# Patient Record
Sex: Female | Born: 1970 | Race: Black or African American | Hispanic: No | Smoking: Current every day smoker
Health system: Southern US, Community
[De-identification: ages and names within clinical notes are randomized; demographics above are authoritative.]

## PROBLEM LIST (undated history)

## (undated) ENCOUNTER — Inpatient Hospital Stay (HOSPITAL_COMMUNITY): Payer: Self-pay

## (undated) DIAGNOSIS — I10 Essential (primary) hypertension: Secondary | ICD-10-CM

## (undated) HISTORY — PX: OTHER SURGICAL HISTORY: SHX169

## (undated) HISTORY — PX: TUBAL LIGATION: SHX77

---

## 2004-01-23 ENCOUNTER — Emergency Department (HOSPITAL_COMMUNITY): Admission: EM | Admit: 2004-01-23 | Discharge: 2004-01-23 | Payer: Self-pay | Admitting: Emergency Medicine

## 2004-10-01 ENCOUNTER — Emergency Department: Payer: Self-pay | Admitting: Emergency Medicine

## 2006-07-28 ENCOUNTER — Ambulatory Visit (HOSPITAL_COMMUNITY): Admission: RE | Admit: 2006-07-28 | Discharge: 2006-07-28 | Payer: Self-pay | Admitting: Family Medicine

## 2007-01-18 ENCOUNTER — Observation Stay (HOSPITAL_COMMUNITY): Admission: AD | Admit: 2007-01-18 | Discharge: 2007-01-18 | Payer: Self-pay | Admitting: Obstetrics and Gynecology

## 2012-06-05 ENCOUNTER — Emergency Department (HOSPITAL_COMMUNITY): Payer: Medicaid Other

## 2012-06-05 ENCOUNTER — Emergency Department (HOSPITAL_COMMUNITY)
Admission: EM | Admit: 2012-06-05 | Discharge: 2012-06-05 | Disposition: A | Discharge status: Home / Self Care | Payer: Medicaid Other | Attending: Emergency Medicine | Admitting: Emergency Medicine

## 2012-06-05 ENCOUNTER — Encounter (HOSPITAL_COMMUNITY): Payer: Self-pay | Admitting: *Deleted

## 2012-06-05 DIAGNOSIS — B9689 Other specified bacterial agents as the cause of diseases classified elsewhere: Secondary | ICD-10-CM | POA: Insufficient documentation

## 2012-06-05 DIAGNOSIS — O2390 Unspecified genitourinary tract infection in pregnancy, unspecified trimester: Secondary | ICD-10-CM | POA: Insufficient documentation

## 2012-06-05 DIAGNOSIS — Z3490 Encounter for supervision of normal pregnancy, unspecified, unspecified trimester: Secondary | ICD-10-CM

## 2012-06-05 DIAGNOSIS — O26899 Other specified pregnancy related conditions, unspecified trimester: Secondary | ICD-10-CM

## 2012-06-05 DIAGNOSIS — N760 Acute vaginitis: Secondary | ICD-10-CM

## 2012-06-05 DIAGNOSIS — I10 Essential (primary) hypertension: Secondary | ICD-10-CM | POA: Insufficient documentation

## 2012-06-05 DIAGNOSIS — F17200 Nicotine dependence, unspecified, uncomplicated: Secondary | ICD-10-CM | POA: Insufficient documentation

## 2012-06-05 DIAGNOSIS — A499 Bacterial infection, unspecified: Secondary | ICD-10-CM | POA: Insufficient documentation

## 2012-06-05 HISTORY — DX: Essential (primary) hypertension: I10

## 2012-06-05 LAB — URINALYSIS, ROUTINE W REFLEX MICROSCOPIC
Bilirubin Urine: NEGATIVE
Glucose, UA: NEGATIVE mg/dL
Hgb urine dipstick: NEGATIVE
Hgb urine dipstick: NEGATIVE
Leukocytes, UA: NEGATIVE
Nitrite: NEGATIVE
Specific Gravity, Urine: 1.02 (ref 1.005–1.030)
Specific Gravity, Urine: 1.015 (ref 1.005–1.030)
Urobilinogen, UA: 0.2 mg/dL (ref 0.0–1.0)
Urobilinogen, UA: 0.2 mg/dL (ref 0.0–1.0)
pH: 6.5 (ref 5.0–8.0)

## 2012-06-05 LAB — URINE MICROSCOPIC-ADD ON: RBC / HPF: NONE SEEN RBC/hpf (ref <3)

## 2012-06-05 LAB — PREGNANCY, URINE: Preg Test, Ur: NEGATIVE

## 2012-06-05 MED ORDER — SODIUM CHLORIDE 0.9 % IV BOLUS (SEPSIS)
1000.0000 mL | Freq: Once | INTRAVENOUS | Status: AC
  Administered 2012-06-05: 1000 mL via INTRAVENOUS

## 2012-06-05 MED ORDER — AZITHROMYCIN 250 MG PO TABS
1000.0000 mg | ORAL_TABLET | Freq: Once | ORAL | Status: AC
  Administered 2012-06-05: 1000 mg via ORAL
  Filled 2012-06-05: qty 4

## 2012-06-05 MED ORDER — METRONIDAZOLE 500 MG PO TABS: 500.0000 mg | ORAL_TABLET | Freq: Two times a day (BID) | ORAL | Status: AC

## 2012-06-05 MED ORDER — LIDOCAINE HCL (PF) 1 % IJ SOLN
INTRAMUSCULAR | Status: AC
  Administered 2012-06-05: 5 mL via INTRAMUSCULAR
  Filled 2012-06-05: qty 5

## 2012-06-05 MED ORDER — CEFTRIAXONE SODIUM 250 MG IJ SOLR
250.0000 mg | Freq: Once | INTRAMUSCULAR | Status: AC
  Administered 2012-06-05: 250 mg via INTRAMUSCULAR
  Filled 2012-06-05: qty 250

## 2012-06-05 MED ORDER — CEPHALEXIN 500 MG PO CAPS: 500.0000 mg | ORAL_CAPSULE | Freq: Four times a day (QID) | ORAL | Status: AC

## 2012-06-05 NOTE — ED Notes (Signed)
In and out  Cath urine specimen obtained. Tests were repeated for accuracy due to initial negative preg test, when pt is in fact [redacted] weeks pregnant.

## 2012-06-05 NOTE — ED Notes (Signed)
Bil flank pain , x 3 days,  [redacted] weeks pregnant.  No vag bleeding. No fever, Has nausea .  No urinary sx.

## 2012-06-05 NOTE — ED Notes (Signed)
Patient transported to Ultrasound 

## 2012-06-05 NOTE — ED Notes (Signed)
Bed changed for pelvic exam per Dr. Denny Levy orders

## 2012-06-05 NOTE — ED Provider Notes (Signed)
History   This chart was scribed for Hilario Quarry, MD by Shari Heritage. The patient was seen in room APA18/APA18. Patient's care was started at 1344.     CSN: [id number]  Arrival date & time 06/05/12  1344   First MD Initiated Contact with Patient 06/05/12 1411      Chief Complaint  Patient presents with  . Flank Pain    pregnant    (Consider location/radiation/quality/duration/timing/severity/associated sxs/prior treatment) The history is provided by the patient. No language interpreter was used.   Madeline Cox is a 41 y.o. female who presents to the Emergency Department complaining of sharp right flank pain onset 3 days ago that has worsened today. Associated symptoms include nausea. Patient is [redacted] weeks pregnant. LMP 03/26/2012. Patient is G5P3A1. Patient's last pregnancy was five years ago. Patient's current care is handled by Dr. Billy Coast at [facility]. Patient denies fever, chills, frequency or urgency of urination, dysuria, hematuria, vomiting or diarrhea. Patient is a sexually active and reports no pain with vaginal intercourse. Patient's other children were delivered at [facility]. Patient with h/o HTN.   PCP - Billy Coast [facility])  Past Medical History  Diagnosis Date  . Hypertension   . Pregnant     Past Surgical History  Procedure Date  . Dilitation and curretage     History reviewed. No pertinent family history.  History  Substance Use Topics  . Smoking status: Current Everyday Smoker  . Smokeless tobacco: Not on file  . Alcohol Use: No    OB History    Grav Para Term Preterm Abortions TAB SAB Ect Mult Living   1               Review of Systems A complete 10 system review of systems was obtained and all systems are negative except as noted in the HPI and PMH.   Allergies  Penicillins  Home Medications   Current Outpatient Rx  Name Route Sig Dispense Refill  . ACETAMINOPHEN 500 MG PO TABS Oral Take 1,000  mg by mouth every 6 (six) hours as needed. For pain    . BC HEADACHE POWDER PO Oral Take 1 packet by mouth daily as needed. For pain    . IBUPROFEN 200 MG PO TABS Oral Take 600 mg by mouth every 6 (six) hours as needed. For pain    . PRENATAL 27-0.8 MG PO TABS Oral Take 1 tablet by mouth daily.      BP 149/91  Pulse 83  Temp 98.8 F (37.1 C) (Oral)  Resp 20  Ht 5\' 4"  (1.626 m)  Wt 165 lb (74.844 kg)  BMI 28.32 kg/m2  SpO2 100%  Physical Exam  Nursing note and vitals reviewed. Constitutional: She is oriented to person, place, and time. She appears well-developed and well-nourished.  HENT:  Head: Normocephalic and atraumatic.  Eyes: Conjunctivae and EOM are normal. Pupils are equal, round, and reactive to light.  Neck: Normal range of motion. Neck supple.  Cardiovascular: Normal rate and regular rhythm.   Pulmonary/Chest: Effort normal and breath sounds normal.  Abdominal: Soft. Bowel sounds are normal.  Genitourinary: Vagina normal.       Tenderness palpated diffusely over uterus but no mass .  Some whitish discharge in vaginal vault.   Musculoskeletal: Normal range of motion.  Neurological: She is alert and oriented to person, place, and time.  Skin: Skin is warm and dry.  Psychiatric: She has a normal mood and affect.  ED Course  Procedures (including critical care time) DIAGNOSTIC STUDIES: Oxygen Saturation is 100% on room air, normal by my interpretation.    COORDINATION OF CARE: 2:15PM- Patient informed of current plan for treatment and evaluation and agrees with plan at this time.      Labs Reviewed  URINALYSIS, ROUTINE W REFLEX MICROSCOPIC   No results found. Results for orders placed during the hospital encounter of 06/05/12  URINALYSIS, ROUTINE W REFLEX MICROSCOPIC      Component Value Range   Color, Urine YELLOW  YELLOW   APPearance CLEAR  CLEAR   Specific Gravity, Urine 1.020  1.005 - 1.030   pH 6.5  5.0 - 8.0   Glucose, UA NEGATIVE  NEGATIVE mg/dL     Hgb urine dipstick NEGATIVE  NEGATIVE   Bilirubin Urine NEGATIVE  NEGATIVE   Ketones, ur NEGATIVE  NEGATIVE mg/dL   Protein, ur NEGATIVE  NEGATIVE mg/dL   Urobilinogen, UA 0.2  0.0 - 1.0 mg/dL   Nitrite NEGATIVE  NEGATIVE   Leukocytes, UA TRACE (*) NEGATIVE  PREGNANCY, URINE      Component Value Range   Preg Test, Ur NEGATIVE  NEGATIVE  WET PREP, GENITAL      Component Value Range   Yeast Wet Prep HPF POC NONE SEEN  NONE SEEN   Trich, Wet Prep NONE SEEN  NONE SEEN   Clue Cells Wet Prep HPF POC MODERATE (*) NONE SEEN   WBC, Wet Prep HPF POC FEW (*) NONE SEEN  URINE MICROSCOPIC-ADD ON      Component Value Range   Squamous Epithelial / LPF FEW (*) RARE   WBC, UA 3-6  <3 WBC/hpf   RBC / HPF    <3 RBC/hpf   Value: NO FORMED ELEMENTS SEEN ON URINE MICROSCOPIC EXAMINATION   Bacteria, UA FEW (*) RARE     No diagnosis found.   No results found.  MDM  Patient with negative pregnancy test here. She has not had any vaginal bleeding. Her last menstrual period was in April. The results of her ultrasound are currently pending. Given the discharge and tenderness on her bimanual exam she was treated for STDs with Rocephin and Zithromax. She will be treated for bacterial vaginitis. Patient has had some crampy and sharp bilateral flank pain.  She states that she is pregnant but does not a positive pregnancy test here. It appear to have acute urinary tract infection. Her abdomen is soft and nontender. She does have some suprapubic and pelvic tenderness.   US Ob Transvaginal  06/05/2012  *RADIOLOGY REPORT*  Clinical Data: Bilateral flank pain. Positive urine pregnancy test  OBSTETRIC <14 WK [place] AND TRANSVAGINAL OB US  Technique:  Both transabdominal and transvaginal ultrasound examinations were performed for complete evaluation of the gestation as well as the maternal uterus, adnexal regions, and pelvic cul-de-sac.  Transvaginal technique was performed to assess early pregnancy.  Comparison:  None.   Intrauterine gestational sac:  Visualized/normal in shape. Yolk sac: Seen Embryo: Seen Cardiac Activity: Seen Heart Rate: 171 bpm  MSD:   mm      w     d CRL: 34.5   mm  10   w  2   d             [place] EDC: 12/30/2012  Maternal uterus/adnexae: A small subacute subchorionic hemorrhage is identified measuring 2.7 x 2.2 x 2.0 cm.  The ovaries are not seen with confidence either transabdominally or endovaginally.  IMPRESSION: Single living intrauterine  pregnancy demonstrating an estimated gestational age by crown-rump length of 10 weeks 2 days.  This correlates well with expected estimated gestational age by LMP of 10 weeks 1 day with corresponding EDC of 12/31/2012.  Small subacute subchorionic hemorrhage.  Non-visualized ovaries  Original Report Authenticated By: Bertha Stakes, M.D.     Patient with 10 week in intrauterine pregnancy seen on ultrasound. She'll have a repeat urine sent for urine pregnancy and repeat urinalysis. She did have Rocephin and Zithromax given here in the emergency department for possible STD and pelvic infection. GC and Chlamydia are pending. Patient received a liter of normal saline and feels improved. She will be treated for bacterial vaginosis urine will be cultured and Keflex prescribed. She is following up at the [facility] where she has been seen previously.  She will continue her prenatal vitamins. She is placed on Flagyl 500 mg by mouth twice a day for 7 days for bacterial vaginosis.  I personally performed the services described in this documentation, which was scribed in my presence. The recorded information has been reviewed and considered.    Hilario Quarry, MD 06/05/12 [phone]

## 2012-06-05 NOTE — Discharge Instructions (Signed)
Bacterial Vaginosis Bacterial vaginosis is an infection of the vagina. A healthy vagina has many kinds of good germs (bacteria). Sometimes the number of good germs can change. This allows bad germs to move in and cause an infection. You may be given medicine (antibiotics) to treat the infection. Or, you may not need treatment at all. HOME CARE  Take your medicine as told. Finish them even if you start to feel better.   Do not have sex until you finish your medicine.   Do not douche.   Practice safe sex.   Tell your sex partner that you have an infection. They should see their doctor for treatment if they have problems.  GET HELP RIGHT AWAY IF:  You do not get better after 3 days of treatment.   You have grey fluid (discharge) coming from your vagina.   You have pain.   You have a temperature of 102 F (38.9 C) or higher.  MAKE SURE YOU:   Understand these instructions.  Will watch your conThreatened Miscarriage Bleeding during the first 20 weeks of pregnancy is common. This is sometimes called a threatened miscarriage. This is a pregnancy that is threatening to end before the twentieth week of pregnancy. Often this bleeding stops with bed rest or decreased activities as suggested by your caregiver and the pregnancy continues without any more problems. You may be asked to not have sexual intercourse, have orgasms or use tampons until further notice. Sometimes a threatened miscarriage can progress to a complete or incomplete miscarriage. This may or may not require further treatment. Some miscarriages occur before a woman misses a menstrual period and knows she is pregnant. Miscarriages occur in 15 to 20% of all pregnancies and usually occur during the first 13 weeks of the pregnancy. The exact cause of a miscarriage is usually never known. A miscarriage is natures way of ending a pregnancy that is abnormal or would not make it to term. There are some things that may put you at risk to have  a miscarriage, such as:  Hormone problems.   Infection of the uterus or cervix.   Chronic illness, diabetes for example, especially if it is not controlled.   Abnormal shaped uterus.   Fibroids in the uterus.   Incompetent cervix (the cervix is too weak to hold the baby).   Smoking.   Drinking too much alcohol. It's best not to drink any alcohol when you are pregnant.   Taking illegal drugs.  TREATMENT  When a miscarriage becomes complete and all products of conception (all the tissue in the uterus) have been passed, often no treatment is needed. If you think you passed tissue, save it in a container and take it to your doctor for evaluation. If the miscarriage is incomplete (parts of the fetus or placenta remain in the uterus), further treatment may be needed. The most common reason for further treatment is continued bleeding (hemorrhage) because pregnancy tissue did not pass out of the uterus. This often occurs if a miscarriage is incomplete. Tissue left behind may also become infected. Treatment usually is dilatation and curettage (the removal of the remaining products of pregnancy. This can be done by a simple sucking procedure (suction curettage) or a simple scraping of the inside of the uterus. This may be done in the hospital or in the caregiver's office. This is only done when your caregiver knows that there is no chance for the pregnancy to proceed to term. This is determined by physical examination, negative  pregnancy test, falling pregnancy hormone count and/or, an ultrasound revealing a dead fetus. Miscarriages are often a very emotional time for prospective mothers and fathers. This is not you or your partners fault. It did not occur because of an inadequacy in you or your partner. Nearly all miscarriages occur because the pregnancy has started off wrongly. At least half of these pregnancies have a chromosomal abnormality. It is almost always not inherited. Others may have  developmental problems with the fetus or placenta. This does not always show up even when the products miscarried are studied under the microscope. The miscarriage is nearly always not your fault and it is not likely that you could have prevented it from happening. If you are having emotional and grieving problems, talk to your health care provider and even seek counseling, if necessary, before getting pregnant again. You can begin trying for another pregnancy as soon as your caregiver says it is OK. HOME CARE INSTRUCTIONS   Your caregiver may order bed rest depending on how much bleeding and cramping you are having. You may be limited to only getting up to go to the bathroom. You may be allowed to continue light activity. You may need to make arrangements for the care of your other children and for any other responsibilities.   Keep track of the number of pads you use each day, how often you have to change pads and how saturated (soaked) they are. Record this information.   DO NOT USE TAMPONS. Do not douche, have sexual intercourse or orgasms until approved by your caregiver.   You may receive a follow up appointment for re-evaluation of your pregnancy and a repeat blood test. Re-evaluation often occurs after 2 days and again in 4 to 6 weeks. It is very important that you follow-up in the recommended time period.   If you are Rh negative and the father is Rh positive or you do not know the fathers' blood type, you may receive a shot (Rh immune globulin) to help prevent abnormal antibodies that can develop and affect the baby in any future pregnancies.  SEEK IMMEDIATE MEDICAL CARE IF:  You have severe cramps in your stomach, back, or abdomen.   You have a sudden onset of severe pain in the lower part of your abdomen.   You develop chills.   You run an unexplained temperature of 101 F (38.3 C) or higher.   You pass large clots or tissue. Save any tissue for your caregiver to inspect.   Your  bleeding increases or you become light-headed, weak, or have fainting episodes.   You have a gush of fluid from your vagina.   You pass out. This could mean you have a tubal (ectopic) pregnancy.  Document Released: 11/25/2005 Document Revised: 11/14/2011 Document Reviewed: 07/11/2008 [facility] Patient Information 2012 [place], [place].Pregnancy If you are planning on getting pregnant, it is a good idea to make a preconception appointment with your care- giver to discuss having a healthy lifestyle before getting pregnant. Such as, diet, weight, exercise, taking prenatal vitamins especially folic acid (it helps prevent brain and spinal cord defects), avoiding alcohol, smoking and illegal drugs, medical problems (diabetes, convulsions), family history of genetic problems, working conditions and immunizations. It is better to have knowledge of these things and do something about them before getting pregnant. In your pregnancy, it is important to follow certain guidelines to have a healthy baby. It is very important to get good prenatal care and follow your caregiver's instructions. Prenatal care  includes all the medical care you receive before your baby's birth. This helps to prevent problems during the pregnancy and childbirth. HOME CARE INSTRUCTIONS   Start your prenatal visits by the 12th week of pregnancy or before when possible. They are usually scheduled monthly at first. They are more often in the last 2 months before delivery. It is important that you keep your caregiver's appointments and follow your caregiver's instructions regarding medication use, exercise, and diet.   During pregnancy, you are providing food for you and your baby. Eat a regular, well-balanced diet. Choose foods such as meat, fish, milk and other dairy products, vegetables, fruits, whole-grain breads and cereals. Your caregiver will inform you of the ideal weight gain depending on your current height and weight. Drink lots of  liquids. Try to drink 8 glasses of water a day.   Alcohol is associated with a number of birth defects including fetal alcohol syndrome. It is best to avoid alcohol completely. Smoking will cause low birth rate and prematurity. Use of alcohol and nicotine during your pregnancy also increases the chances that your child will be chemically dependent later in their life and may contribute to SIDS (Sudden Infant Death Syndrome).   Do not use illegal drugs.   Only take prescription or over-the-counter medications that are recommended by your caregiver. Other medications can cause genetic and physical problems in the baby.   Morning sickness can often be helped by keeping soda crackers at the bedside. Eat a couple before arising in the morning.   A sexual relationship may be continued until near the end of pregnancy if there are no other problems such as early (premature) leaking of amniotic fluid from the membranes, vaginal bleeding, painful intercourse or belly (abdominal) pain.   Exercise regularly. Check with your caregiver if you are unsure of the safety of some of your exercises.   Do not use hot tubs, steam rooms or saunas. These increase the risk of fainting or passing out and hurting yourself and the baby. Swimming is OK for exercise. Get plenty of rest, including afternoon naps when possible especially in the third trimester.   Avoid toxic odors and chemicals.   Do not wear high heels. They may cause you to lose your balance and fall.   Do not lift over 5 pounds. If you do lift anything, lift with your legs and thighs, not your back.   Avoid long trips, especially in the third trimester.   If you have to travel out of the city or state, take a copy of your medical records with you.  SEEK IMMEDIATE MEDICAL CARE IF:   You develop an unexplained oral temperature above 102 F (38.9 C), or as your caregiver suggests.   You have leaking of fluid from the vagina. If leaking membranes are  suspected, take your temperature and inform your caregiver of this when you call.   There is vaginal spotting or bleeding. Notify your caregiver of the amount and how many pads are used.   You continue to feel sick to your stomach (nauseous) and have no relief from remedies suggested, or you throw up (vomit) blood or coffee ground like materials.   You develop upper abdominal pain.   You have round ligament discomfort in the lower abdominal area. This still must be evaluated by your caregiver.   You feel contractions of the uterus.   You do not feel the baby move, or there is less movement than before.   You have painful  urination.   You have abnormal vaginal discharge.   You have persistent diarrhea.   You get a severe headache.   You have problems with your vision.   You develop muscle weakness.   You feel dizzy and faint.   You develop shortness of breath.   You develop chest pain.   You have back pain that travels down to your leg and feet.   You feel irregular or a very fast heartbeat.   You develop excessive weight gain in a short period of time (5 pounds in 3 to 5 days).   You are involved with a domestic violence situation.  Document Released: 11/25/2005 Document Revised: 11/14/2011 Document Reviewed: 05/19/2009  [employer] Patient Information 2012 [employer]dition.   Will get help right away if you are not doing well or get worse.  Document Released: 09/03/2008 Document Revised: 11/14/2011 Document Reviewed: 09/03/2008 [facility] Patient Information 2012 [place], [place].

## 2012-06-06 LAB — GC/CHLAMYDIA PROBE AMP, GENITAL: Chlamydia, DNA Probe: NEGATIVE

## 2012-11-13 ENCOUNTER — Encounter (HOSPITAL_COMMUNITY): Payer: Self-pay | Admitting: *Deleted

## 2012-11-13 ENCOUNTER — Emergency Department (HOSPITAL_COMMUNITY)
Admission: EM | Admit: 2012-11-13 | Discharge: 2012-11-13 | Disposition: A | Discharge status: Home / Self Care | Payer: Medicaid Other | Attending: Emergency Medicine | Admitting: Emergency Medicine

## 2012-11-13 ENCOUNTER — Emergency Department (HOSPITAL_COMMUNITY): Payer: Medicaid Other

## 2012-11-13 DIAGNOSIS — R079 Chest pain, unspecified: Secondary | ICD-10-CM

## 2012-11-13 DIAGNOSIS — Z9889 Other specified postprocedural states: Secondary | ICD-10-CM | POA: Insufficient documentation

## 2012-11-13 DIAGNOSIS — R0789 Other chest pain: Secondary | ICD-10-CM | POA: Insufficient documentation

## 2012-11-13 DIAGNOSIS — R002 Palpitations: Secondary | ICD-10-CM | POA: Insufficient documentation

## 2012-11-13 DIAGNOSIS — O99330 Smoking (tobacco) complicating pregnancy, unspecified trimester: Secondary | ICD-10-CM | POA: Insufficient documentation

## 2012-11-13 DIAGNOSIS — O9989 Other specified diseases and conditions complicating pregnancy, childbirth and the puerperium: Secondary | ICD-10-CM | POA: Insufficient documentation

## 2012-11-13 DIAGNOSIS — Z3490 Encounter for supervision of normal pregnancy, unspecified, unspecified trimester: Secondary | ICD-10-CM

## 2012-11-13 LAB — CBC WITH DIFFERENTIAL/PLATELET
Basophils Absolute: 0 10*3/uL (ref 0.0–0.1)
Hemoglobin: 11.2 g/dL — ABNORMAL LOW (ref 12.0–15.0)
Lymphocytes Relative: 27 % (ref 12–46)
Lymphs Abs: 2.5 10*3/uL (ref 0.7–4.0)
MCH: 28.3 pg (ref 26.0–34.0)
Neutrophils Relative %: 63 % (ref 43–77)
RBC: 3.96 MIL/uL (ref 3.87–5.11)
WBC: 9.2 10*3/uL (ref 4.0–10.5)

## 2012-11-13 LAB — URINALYSIS, ROUTINE W REFLEX MICROSCOPIC
Hgb urine dipstick: NEGATIVE
Leukocytes, UA: NEGATIVE
Protein, ur: NEGATIVE mg/dL
Urobilinogen, UA: 0.2 mg/dL (ref 0.0–1.0)

## 2012-11-13 LAB — LACTATE DEHYDROGENASE: LDH: 138 U/L (ref 94–250)

## 2012-11-13 LAB — BASIC METABOLIC PANEL: GFR calc Af Amer: >90 mL/min (ref >90)

## 2012-11-13 MED ORDER — ACETAMINOPHEN 500 MG PO TABS
1000.0000 mg | ORAL_TABLET | Freq: Once | ORAL | Status: AC
  Administered 2012-11-13: 1000 mg via ORAL
  Filled 2012-11-13: qty 2

## 2012-11-13 MED ORDER — SODIUM CHLORIDE 0.9 % IV SOLN
Freq: Once | INTRAVENOUS | Status: AC
  Administered 2012-11-13: 21:00:00 via INTRAVENOUS

## 2012-11-13 MED ORDER — IOHEXOL 350 MG/ML SOLN
100.0000 mL | Freq: Once | INTRAVENOUS | Status: AC | PRN
  Administered 2012-11-13: 100 mL via INTRAVENOUS

## 2012-11-13 NOTE — ED Provider Notes (Signed)
History     CSN: [id number]  Arrival date & time 11/13/12  Madeline Cox   First MD Initiated Contact with Patient 11/13/12 1914      Chief Complaint  Patient presents with  . Chest Pain    (Consider location/radiation/quality/duration/timing/severity/associated sxs/prior treatment) HPI  Patient is G5 P3 AB 1, EDC January 23. Patient reports she has had a normal pregnancy. She was seen yesterday at the [facility] and is to be seen again in 2 weeks. She reports her fingers have felt numb for the past 2 weeks. She relates about 3 PM today while she was at work she started feeling a fluttering in her chest that lasted about 15 seconds and happened twice. She relates some chest pain in her right upper chest that is waxing and waning with short jabs and then a resulting pressure. She states she has felt lightheaded all day. She states she feels nervous. She has had nausea without vomiting, diarrhea, coughing, fever, urgency, vaginal discharge or bleeding. She states her ankles have been swelling for about a week and she has been short of breath for a period of time.  PCP [facility] health Department  Past Medical History  Diagnosis Date  . Hypertension   . Pregnant     Past Surgical History  Procedure Date  . Dilitation and curretage     History reviewed. No pertinent family history.  History  Substance Use Topics  . Smoking status: Current Every Day Smoker  . Smokeless tobacco: Not on file  . Alcohol Use: No  employed  OB History    Grav Para Term Preterm Abortions TAB SAB Ect Mult Living   1               Review of Systems  All other systems reviewed and are negative.    Allergies  Penicillins  Home Medications   Current Outpatient Rx  Name  Route  Sig  Dispense  Refill  . ACETAMINOPHEN 500 MG PO TABS   Oral   Take 500-1,000 mg by mouth every 6 (six) hours as needed. For pain         . HYDROCODONE-ACETAMINOPHEN 5-500 MG PO TABS   Oral  Take 1 tablet by mouth every 6 (six) hours as needed. For dental pain         . IBUPROFEN 200 MG PO TABS   Oral   Take 800 mg by mouth every 6 (six) hours as needed. For pain         . PRENATAL 27-0.8 MG PO TABS   Oral   Take 1 tablet by mouth daily.           BP 138/74  Pulse 94  Temp 98.3 F (36.8 C) (Oral)  Resp 18  Ht 5\' 4"  (1.626 m)  Wt 183 lb (83.008 kg)  BMI 31.41 kg/m2  SpO2 99%  Vital signs normal    Physical Exam  Nursing note and vitals reviewed. Constitutional: She is oriented to person, place, and time. She appears well-developed and well-nourished.  Non-toxic appearance. She does not appear ill. No distress.  HENT:  Head: Normocephalic and atraumatic.  Right Ear: External ear normal.  Left Ear: External ear normal.  Nose: Nose normal. No mucosal edema or rhinorrhea.  Mouth/Throat: Oropharynx is clear and moist and mucous membranes are normal. No dental abscesses or uvula swelling.  Eyes: Conjunctivae normal and EOM are normal. Pupils are equal, round, and reactive to light.  Neck: Normal  range of motion and full passive range of motion without pain. Neck supple.  Cardiovascular: Normal rate, regular rhythm and normal heart sounds.  Exam reveals no gallop and no friction rub.   No murmur heard. Pulmonary/Chest: Effort normal and breath sounds normal. No respiratory distress. She has no wheezes. She has no rhonchi. She has no rales. She exhibits no tenderness and no crepitus.         Area of pain noted  Abdominal: Soft. Normal appearance and bowel sounds are normal. She exhibits no distension. There is no tenderness. There is no rebound and no guarding.       Abdomen consistent with dates and is nontender  FHT 145  Musculoskeletal: Normal range of motion. She exhibits edema. She exhibits no tenderness.       Trace edema of lower legs Moves all extremities well.   Neurological: She is alert and oriented to person, place, and time. She has normal  strength. No cranial nerve deficit.  Skin: Skin is warm, dry and intact. No rash noted. No erythema. No pallor.  Psychiatric: She has a normal mood and affect. Her speech is normal and behavior is normal. Her mood appears not anxious.    ED Course  Procedures (including critical care time)   Medications  HYDROcodone-acetaminophen (VICODIN) 5-500 MG per tablet (not administered)  0.9 %  sodium chloride infusion (  Intravenous New Bag/Given 11/13/12 2032)  iohexol (OMNIPAQUE) 350 MG/ML injection 100 mL (100 mL Intravenous Contrast Given 11/13/12 2053)  acetaminophen (TYLENOL) tablet 1,000 mg (1000 mg Oral Given 11/13/12 2133)   21:15 D/W Dr Si Gaul,  radiologist, states the CT missed filling of the pulmonary artery tree, wants to repeat the scan.   21:25 D/W Dr Emelda Fear agrees to not repeat CT scan, if she had a significant clot feels would see some delayed filling defect feels she can be discharged.  21:30 Discussed with Dr Si Gaul again.   No arrythmia on the monitor.   Results for orders placed during the hospital encounter of 11/13/12  CBC WITH DIFFERENTIAL      Component Value Range   WBC 9.2  4.0 - 10.5 K/uL   RBC 3.96  3.87 - 5.11 MIL/uL   Hemoglobin 11.2 (*) 12.0 - 15.0 g/dL   HCT 16.1 (*) 09.6 - 04.5 %   MCV 78.0  78.0 - 100.0 fL   MCH 28.3  26.0 - 34.0 pg   MCHC 36.2 (*) 30.0 - 36.0 g/dL   RDW 40.9  81.1 - 91.4 %   Platelets 257  150 - 400 K/uL   Neutrophils Relative 63  43 - 77 %   Neutro Abs 5.8  1.7 - 7.7 K/uL   Lymphocytes Relative 27  12 - 46 %   Lymphs Abs 2.5  0.7 - 4.0 K/uL   Monocytes Relative 9  3 - 12 %   Monocytes Absolute 0.8  0.1 - 1.0 K/uL   Eosinophils Relative 0  0 - 5 %   Eosinophils Absolute 0.0  0.0 - 0.7 K/uL   Basophils Relative 0  0 - 1 %   Basophils Absolute 0.0  0.0 - 0.1 K/uL  BASIC METABOLIC PANEL      Component Value Range   Sodium 135  135 - 145 mEq/L   Potassium 3.5  3.5 - 5.1 mEq/L   Chloride 103  96 - 112 mEq/L   CO2 20  19 - 32 mEq/L    Glucose, Bld 87  70 - 99  mg/dL   BUN 3 (*) 6 - 23 mg/dL   Creatinine, Ser 1.61 (*) 0.50 - 1.10 mg/dL   Calcium 8.8  8.4 - 09.6 mg/dL   GFR calc non Af Amer >90  >90 mL/min   GFR calc Af Amer >90  >90 mL/min  TROPONIN I      Component Value Range   Troponin I <0.30  <0.30 ng/mL  URINALYSIS, ROUTINE W REFLEX MICROSCOPIC      Component Value Range   Color, Urine YELLOW  YELLOW   APPearance CLEAR  CLEAR   Specific Gravity, Urine 1.010  1.005 - 1.030   pH 6.0  5.0 - 8.0   Glucose, UA NEGATIVE  NEGATIVE mg/dL   Hgb urine dipstick NEGATIVE  NEGATIVE   Bilirubin Urine NEGATIVE  NEGATIVE   Ketones, ur NEGATIVE  NEGATIVE mg/dL   Protein, ur NEGATIVE  NEGATIVE mg/dL   Urobilinogen, UA 0.2  0.0 - 1.0 mg/dL   Nitrite NEGATIVE  NEGATIVE   Leukocytes, UA NEGATIVE  NEGATIVE  LACTATE DEHYDROGENASE      Component Value Range   LDH 138  94 - 250 U/L   Laboratory interpretation all normal except mild anemia     No results found.    Date: 11/13/2012  Rate: 93  Rhythm: normal sinus rhythm  QRS Axis: normal  Intervals: normal  ST/T Wave abnormalities: normal  Conduction Disutrbances:none  Narrative Interpretation:   Old EKG Reviewed: none available    1. Palpitations   2. Chest pain   3. Pregnancy    Plan discharge  Devoria Albe, MD, FACEP    MDM          Ward Givens, MD 11/13/12 2134

## 2012-11-13 NOTE — ED Notes (Signed)
Chest pain , felt heart was fluttering, dizzy,  Pt is [redacted] weeks pregnant, has felt fetal movement,  No vag bleeding.

## 2012-11-13 NOTE — ED Notes (Signed)
Pt complaining of chest pain and palpitations. Also [redacted] weeks pregnant. Pt states she has been feeling normal fetal movement. Fetal heart tones 140-145. NAD noted. NSR on monitor.

## 2012-12-05 ENCOUNTER — Encounter (HOSPITAL_COMMUNITY): Payer: Self-pay | Admitting: Emergency Medicine

## 2012-12-05 ENCOUNTER — Inpatient Hospital Stay (HOSPITAL_COMMUNITY)
Admission: EM | Admit: 2012-12-05 | Discharge: 2012-12-05 | Disposition: A | Discharge status: Home / Self Care | Payer: Medicaid Other | Attending: Emergency Medicine | Admitting: Emergency Medicine

## 2012-12-05 DIAGNOSIS — O10919 Unspecified pre-existing hypertension complicating pregnancy, unspecified trimester: Secondary | ICD-10-CM | POA: Insufficient documentation

## 2012-12-05 DIAGNOSIS — Z79899 Other long term (current) drug therapy: Secondary | ICD-10-CM | POA: Insufficient documentation

## 2012-12-05 DIAGNOSIS — O479 False labor, unspecified: Secondary | ICD-10-CM

## 2012-12-05 DIAGNOSIS — O4700 False labor before 37 completed weeks of gestation, unspecified trimester: Secondary | ICD-10-CM

## 2012-12-05 DIAGNOSIS — O331 Maternal care for disproportion due to generally contracted pelvis: Secondary | ICD-10-CM | POA: Insufficient documentation

## 2012-12-05 DIAGNOSIS — F17200 Nicotine dependence, unspecified, uncomplicated: Secondary | ICD-10-CM | POA: Insufficient documentation

## 2012-12-05 LAB — COMPREHENSIVE METABOLIC PANEL
ALT: 7 U/L (ref 0–35)
AST: 13 U/L (ref 0–37)
Albumin: 2.6 g/dL — ABNORMAL LOW (ref 3.5–5.2)
Alkaline Phosphatase: 142 U/L — ABNORMAL HIGH (ref 39–117)
CO2: 23 mEq/L (ref 19–32)
Chloride: 100 mEq/L (ref 96–112)
GFR calc non Af Amer: >90 mL/min (ref >90)
Potassium: 3.5 mEq/L (ref 3.5–5.1)
Total Bilirubin: 0.5 mg/dL (ref 0.3–1.2)

## 2012-12-05 LAB — URINALYSIS, ROUTINE W REFLEX MICROSCOPIC
Bilirubin Urine: NEGATIVE
Glucose, UA: NEGATIVE mg/dL
Hgb urine dipstick: NEGATIVE
Ketones, ur: >80 mg/dL — AB
Leukocytes, UA: NEGATIVE
Nitrite: NEGATIVE
Protein, ur: NEGATIVE mg/dL
Specific Gravity, Urine: 1.025 (ref 1.005–1.030)
Urobilinogen, UA: 0.2 mg/dL (ref 0.0–1.0)
pH: 6.5 (ref 5.0–8.0)

## 2012-12-05 LAB — CBC
Hemoglobin: 11.1 g/dL — ABNORMAL LOW (ref 12.0–15.0)
MCV: 76.4 fL — ABNORMAL LOW (ref 78.0–100.0)
Platelets: 262 10*3/uL (ref 150–400)
RDW: 14.1 % (ref 11.5–15.5)

## 2012-12-05 MED ORDER — PANTOPRAZOLE SODIUM 20 MG PO TBEC
20.0000 mg | DELAYED_RELEASE_TABLET | Freq: Once | ORAL | Status: AC
  Administered 2012-12-05: 20 mg via ORAL
  Filled 2012-12-05: qty 1

## 2012-12-05 MED ORDER — LABETALOL HCL 100 MG PO TABS
200.0000 mg | ORAL_TABLET | Freq: Once | ORAL | Status: AC
  Administered 2012-12-05: 200 mg via ORAL
  Filled 2012-12-05: qty 2

## 2012-12-05 MED ORDER — LABETALOL HCL 200 MG PO TABS: 200.0000 mg | ORAL_TABLET | Freq: Two times a day (BID) | ORAL | Status: DC

## 2012-12-05 MED ORDER — LABETALOL HCL 200 MG PO TABS: 200.0000 mg | ORAL_TABLET | Freq: Once | ORAL | Status: DC

## 2012-12-05 NOTE — ED Provider Notes (Signed)
History   This chart was scribed for Raeford Razor, MD by Sofie Rower, ED Scribe. The patient was seen in room APA01/APA01 and the patient's care was started at 11:35AM.  Level 5 Caveat: Immediate Medical Intervention  CSN: [id number]  Arrival date & time 12/05/12  1121   None     Chief Complaint  Patient presents with  . Vaginal Discharge    [redacted] weeks pregnant    (Consider location/radiation/quality/duration/timing/severity/associated sxs/prior treatment) Patient is a 41 y.o. female presenting with vaginal discharge. The history is provided by the patient. No language interpreter was used.  Vaginal Discharge This is a new problem. The current episode started more than 2 days ago (3 days ago). The problem occurs constantly. The problem has been gradually worsening. Pertinent negatives include no headaches. Nothing aggravates the symptoms. Nothing relieves the symptoms. She has tried nothing for the symptoms. The treatment provided no relief.    Madeline Cox is a 41 y.o. female , with a hx of 37 week pregnancy (G:5: P:3 A:1) and hypertension, who presents to the Emergency Department complaining of sudden, progressively worsening, vaginal discharge, onset three days ago (11/02/12).  Associated symptoms include abdominal cramping. The pt reports she began to notice an unusual, intermittent, abdominal cramping and abnormal vaginal discharge three days ago. The pt informs the abdominal cramping lasts 5-10 minutes in duration.   The pt denies headaches and visual disturbance.    The pt is a current everyday smoker, however, she does not drink alcohol.   Previous deliveries Madeline Cox, [place].    Past Medical History  Diagnosis Date  . Hypertension   . Pregnant     Past Surgical History  Procedure Date  . Dilitation and curretage     History reviewed. No pertinent family history.  History  Substance Use Topics  . Smoking status: Current Every Day Smoker  . Smokeless tobacco: Not  on file  . Alcohol Use: No    OB History    Grav Para Term Preterm Abortions TAB SAB Ect Mult Living   5 3   1  1   3       Review of Systems  Genitourinary: Positive for vaginal discharge.  Neurological: Negative for headaches.  All other systems reviewed and are negative.    Allergies  Penicillins  Home Medications   Current Outpatient Rx  Name  Route  Sig  Dispense  Refill  . ACETAMINOPHEN 500 MG PO TABS   Oral   Take 500-1,000 mg by mouth every 6 (six) hours as needed. For pain         . HYDROCODONE-ACETAMINOPHEN 5-500 MG PO TABS   Oral   Take 1 tablet by mouth every 6 (six) hours as needed. For dental pain         . IBUPROFEN 200 MG PO TABS   Oral   Take 800 mg by mouth every 6 (six) hours as needed. For pain         . PRENATAL 27-0.8 MG PO TABS   Oral   Take 1 tablet by mouth daily.           BP 157/88  Pulse 106  Temp 98 F (36.7 C) (Oral)  SpO2 100%  Physical Exam  Nursing note and vitals reviewed. Constitutional: She appears well-developed and well-nourished. No distress.  HENT:  Head: Normocephalic and atraumatic.  Eyes: Conjunctivae normal are normal. Right eye exhibits no discharge. Left eye exhibits no discharge.  Neck: Neck supple.  Cardiovascular: Normal rate, regular rhythm and normal heart sounds.  Exam reveals no gallop and no friction rub.   No murmur heard. Pulmonary/Chest: Effort normal and breath sounds normal. No respiratory distress.  Abdominal: Soft. She exhibits no distension. There is no tenderness.  Genitourinary:       Gravid uterus. Consistent with reported [redacted] week gestation period. Cervix dilated ~2cm.  Musculoskeletal: She exhibits no edema and no tenderness.  Neurological: She is alert.  Skin: Skin is warm and dry.  Psychiatric: She has a normal mood and affect. Her behavior is normal. Thought content normal.    ED Course  Procedures (including critical care time)  DIAGNOSTIC STUDIES: Oxygen Saturation is  100% on room air, normal by my interpretation.    COORDINATION OF CARE:  11:43 AM- Treatment plan discussed with patient. Pt agrees with treatment.  12:13PM- Phone consultation with on Call OB/GYN at [facility]. Pt's condition as well as corresponding risk factors including hypertension discussed.  12:20Pm- Recheck. Treatment plan concerning hospital admission discussed with patient. Pt agrees with treatment.  12:42 PM- Recheck. Treatment plan discussed with patient. Pt agrees with treatment.        Labs Reviewed  URINALYSIS, ROUTINE W REFLEX MICROSCOPIC - Abnormal; Notable for the following:    APPearance HAZY (*)     Ketones, ur >80 (*)     All other components within normal limits   No results found.   1. Preterm uterine contractions       MDM  41 year old female at 36 weeks and 2 days gestation. Contractions at approximately 8 minutes. Fetal heart rate with good variability with baseline around 140. Patient is hypertensive, but this is chronic. She is not complaining of any headaches, increasing swelling and she has no proteinuria to suggest preeclampsia. Discussed with OB. Will transfer to [facility] for further monitoring/eval.    I personally preformed the services scribed in my presence. The recorded information has been reviewed and considered. Raeford Razor, MD.    Raeford Razor, MD 12/05/12 [phone]

## 2012-12-05 NOTE — MAU Provider Note (Signed)
Chart reviewed and agree with management and plan.  

## 2012-12-05 NOTE — Progress Notes (Signed)
Isabel Caprice, RN was asked to place pt on EFM while RN in with another pt.

## 2012-12-05 NOTE — Progress Notes (Signed)
RROB spoke with ED-RN; pt in ed due to c/o contractions and increased discharge/leaking.  Pt is high risk due to hypertension, supposed to be managed at [facility], but has been receiving care at health department.  Plan to consult [facility] about care, etc.

## 2012-12-05 NOTE — Progress Notes (Signed)
Joellyn Haff, CNM at bedside - called to a delivery

## 2012-12-05 NOTE — Progress Notes (Signed)
Meal tray ordered 

## 2012-12-05 NOTE — ED Notes (Signed)
Pt c/o cramping and vaginal discharge x3 days. Pt is [redacted] weeks pregnant. Pt states cramping is intermittent. Cramping "last about 5-10 minutes".

## 2012-12-05 NOTE — ED Notes (Signed)
Rapid response RN reports that Baby looks "good" and pt may have had 2 or 3 contractions over .  Reports contractions have been approx apart.

## 2012-12-05 NOTE — ED Notes (Signed)
Pt on fetal monitor, Dr. Juleen China at bedside.

## 2012-12-05 NOTE — ED Notes (Signed)
Pt requesting to go to [facility] instead of [place].  Dr. Juleen China in room talking with pt.

## 2012-12-05 NOTE — MAU Provider Note (Signed)
History     CSN: [id number]  Arrival date and time: 12/05/12 1121   First Provider Initiated Contact with Patient 12/05/12 1805      Chief Complaint  Patient presents with  . Vaginal Discharge    [redacted] weeks pregnant   HPI Madeline Cox is a 41 y.o. [phone] female at [redacted]w[redacted]d who presents as transfer from [place] er w/ report of contractions and increased clear mucousy d/c that began this am.  Denies abnormal or malodorous d/c, vulvovaginal itching/irriation, urinary frequency, hesitancy, urgency, or dysuria. Reports good fm.  Denies vb or lof.  PNC at [facility] beginning at app 6wks, last appt last thurs, next appt this coming thurs.  Was on norvasc for chtn prior to pregnancy- was d/c'd when went to HD to begin care and has not been placed on any other antihypertensives during the pregnancy. 1hr glucola and anatomy screen wnl per pt.  Declined genetic screening.  No other complications during pregnancy.  Occ HA's for years, no changes w/ pregnancy, occ seeing spots started 1 month ago- not at present, denies ruq/epigastric pain, denies vomiting, but has some nausea since yesterday. Heartburn throughout pregnancy, worse in last 3 months- controlled well w/ zantac- requesting med now.  OB History    Grav Para Term Preterm Abortions TAB SAB Ect Mult Living   5 3 2 1 1  1   3       Past Medical History  Diagnosis Date  . Pregnant   . Hypertension     chronic    Past Surgical History  Procedure Date  . Dilitation and curretage     History reviewed. No pertinent family history.  History  Substance Use Topics  . Smoking status: Current Every Day Smoker  . Smokeless tobacco: Not on file  . Alcohol Use: No    Allergies:  Allergies  Allergen Reactions  . Penicillins Anaphylaxis    Prescriptions prior to admission  Medication Sig Dispense Refill  . acetaminophen-codeine (TYLENOL #3) 300-30 MG per tablet Take 1 tablet by mouth every 6 (six) hours as needed. For pain       . Prenatal Vit-Fe Fumarate-FA (MULTIVITAMIN-PRENATAL) 27-0.8 MG TABS Take 1 tablet by mouth daily.      . ranitidine (ZANTAC) 75 MG tablet Take 75-150 mg by mouth 2 (two) times daily as needed. For heartburn      . zolpidem (AMBIEN) 10 MG tablet Take 10 mg by mouth at bedtime as needed.        Review of Systems  Constitutional: Negative.   Eyes: Negative.   Respiratory: Negative.   Cardiovascular: Negative.   Gastrointestinal: Positive for heartburn and nausea. Negative for vomiting. Abdominal pain: intermittent low abd pain.  Genitourinary: Negative.   Musculoskeletal: Negative.   Skin: Negative.   Neurological: Positive for headaches.  Endo/Heme/Allergies: Negative.   Psychiatric/Behavioral: Negative.    Physical Exam   Blood pressure 157/86, pulse 88, temperature 98 F (36.7 C), temperature source Oral, resp. rate 18, SpO2 98.00%.  Physical Exam  Constitutional: She is oriented to person, place, and time. She appears well-developed and well-nourished.  HENT:  Head: Normocephalic.  Neck: Normal range of motion.  Cardiovascular: Normal rate and regular rhythm.   Respiratory: Effort normal and breath sounds normal.  GI: Soft.       gravid  Genitourinary:       SVE: 2.5/th/-3, vtx, ballotable, posterior  Musculoskeletal: Normal range of motion. She exhibits edema (1+ BLE edema).  Neurological: She  is alert and oriented to person, place, and time. She has normal reflexes.       No clonus  Skin: Skin is warm and dry.  Psychiatric: She has a normal mood and affect. Her behavior is normal. Judgment and thought content normal.   FHR: 130, mod variability, 15x15accels, no decels= Cat I UCs: mild, irregular  MAU Course  Procedures  EFM CBC, CMP, urine P/C ratio- cancelled SVE Protonix 20mg  po for report of heartburn Labetalol 200mg  po x 1 Attempted to obtain prenatal records from [facility]- only sent genetic counseling and u/s report- state they do not have any prenatal  records  Results for orders placed during the hospital encounter of 12/05/12 (from the past 24 hour(s))  URINALYSIS, ROUTINE W REFLEX MICROSCOPIC     Status: Abnormal   Collection Time   12/05/12 12:24 PM      Component Value Range   Color, Urine YELLOW  YELLOW   APPearance HAZY (*) CLEAR   Specific Gravity, Urine 1.025  1.005 - 1.030   pH 6.5  5.0 - 8.0   Glucose, UA NEGATIVE  NEGATIVE mg/dL   Hgb urine dipstick NEGATIVE  NEGATIVE   Bilirubin Urine NEGATIVE  NEGATIVE   Ketones, ur >80 (*) NEGATIVE mg/dL   Protein, ur NEGATIVE  NEGATIVE mg/dL   Urobilinogen, UA 0.2  0.0 - 1.0 mg/dL   Nitrite NEGATIVE  NEGATIVE   Leukocytes, UA NEGATIVE  NEGATIVE  CBC     Status: Abnormal   Collection Time   12/05/12  6:38 PM      Component Value Range   WBC 9.3  4.0 - 10.5 K/uL   RBC 4.20  3.87 - 5.11 MIL/uL   Hemoglobin 11.1 (*) 12.0 - 15.0 g/dL   HCT 40.9 (*) 81.1 - 91.4 %   MCV 76.4 (*) 78.0 - 100.0 fL   MCH 26.4  26.0 - 34.0 pg   MCHC 34.6  30.0 - 36.0 g/dL   RDW 78.2  95.6 - 21.3 %   Platelets 262  150 - 400 K/uL  COMPREHENSIVE METABOLIC PANEL     Status: Abnormal   Collection Time   12/05/12  6:38 PM      Component Value Range   Sodium 134 (*) 135 - 145 mEq/L   Potassium 3.5  3.5 - 5.1 mEq/L   Chloride 100  96 - 112 mEq/L   CO2 23  19 - 32 mEq/L   Glucose, Bld 93  70 - 99 mg/dL   BUN 4 (*) 6 - 23 mg/dL   Creatinine, Ser 0.86 (*) 0.50 - 1.10 mg/dL   Calcium 8.9  8.4 - 57.8 mg/dL   Total Protein 6.1  6.0 - 8.3 g/dL   Albumin 2.6 (*) 3.5 - 5.2 g/dL   AST 13  0 - 37 U/L   ALT 7  0 - 35 U/L   Alkaline Phosphatase 142 (*) 39 - 117 U/L   Total Bilirubin 0.5  0.3 - 1.2 mg/dL   GFR calc non Af Amer >90  >90 mL/min   GFR calc Af Amer >90  >90 mL/min   Follow-up Information    Follow up with [facility]. On 12/07/2012. (Call on Monday to schedule an appointment to be seen as soon as possible. )          Madeline Cox, [name]  Home Medication Instructions  ION:[id number]   Printed on:12/05/12 2033  Medication Information  Prenatal Vit-Fe Fumarate-FA (MULTIVITAMIN-PRENATAL) 27-0.8 MG TABS Take 1 tablet by mouth daily.           acetaminophen-codeine (TYLENOL #3) 300-30 MG per tablet Take 1 tablet by mouth every 6 (six) hours as needed. For pain           ranitidine (ZANTAC) 75 MG tablet Take 75-150 mg by mouth 2 (two) times daily as needed. For heartburn           zolpidem (AMBIEN) 10 MG tablet Take 10 mg by mouth at bedtime as needed.           labetalol (NORMODYNE) 200 MG tablet Take 1 tablet (200 mg total) by mouth once.              Assessment and Plan  A:  [redacted]w[redacted]d SIUP  Cat I FHR  CHTN w/ no evidence of pre-eclampsia at this time  Braxton Hick's   P:  D/C home  Rx Labetalol 200mg  po bid   F/U @ [facility] on Monday  Reviewed pre-e warning s/s   Discussed all w/ Dr. Hassan Rowan, [place] Every 12/05/2012, 6:39 PM

## 2012-12-05 NOTE — Progress Notes (Signed)
Spoke with Verlon Au RN at [facility] ED and advised that FHR reassuring with possible UI or contractions. AP MD will follow up with Dr. Penne Lash for further orders.

## 2012-12-05 NOTE — Progress Notes (Addendum)
Call placed to inquire about how much longer for meal tray.  Informed that "It has been marked ready and should be leaving the kitchen shortly."

## 2012-12-05 NOTE — Progress Notes (Signed)
Call placed again due to no meal tray brought for pt.  Informed that "it left the kitchen 12 minutes ago and should be there in a few minutes."

## 2012-12-05 NOTE — ED Notes (Signed)
Spoke with Florentina Addison, rapid response RN at [facility] and she will monitor pt.  Reports pt is having contractions.

## 2013-07-02 ENCOUNTER — Emergency Department (HOSPITAL_COMMUNITY): Payer: No Typology Code available for payment source

## 2013-07-02 ENCOUNTER — Emergency Department (HOSPITAL_COMMUNITY)
Admission: EM | Admit: 2013-07-02 | Discharge: 2013-07-02 | Disposition: A | Discharge status: Home / Self Care | Payer: No Typology Code available for payment source | Attending: Emergency Medicine | Admitting: Emergency Medicine

## 2013-07-02 ENCOUNTER — Encounter (HOSPITAL_COMMUNITY): Payer: Self-pay | Admitting: *Deleted

## 2013-07-02 DIAGNOSIS — M25551 Pain in right hip: Secondary | ICD-10-CM

## 2013-07-02 DIAGNOSIS — Z880 Allergy status to penicillin: Secondary | ICD-10-CM | POA: Insufficient documentation

## 2013-07-02 DIAGNOSIS — S79919A Unspecified injury of unspecified hip, initial encounter: Secondary | ICD-10-CM | POA: Insufficient documentation

## 2013-07-02 DIAGNOSIS — Z79899 Other long term (current) drug therapy: Secondary | ICD-10-CM | POA: Insufficient documentation

## 2013-07-02 DIAGNOSIS — R51 Headache: Secondary | ICD-10-CM | POA: Insufficient documentation

## 2013-07-02 DIAGNOSIS — R209 Unspecified disturbances of skin sensation: Secondary | ICD-10-CM | POA: Insufficient documentation

## 2013-07-02 DIAGNOSIS — Y9389 Activity, other specified: Secondary | ICD-10-CM | POA: Insufficient documentation

## 2013-07-02 DIAGNOSIS — S79929A Unspecified injury of unspecified thigh, initial encounter: Secondary | ICD-10-CM | POA: Insufficient documentation

## 2013-07-02 DIAGNOSIS — Y92410 Unspecified street and highway as the place of occurrence of the external cause: Secondary | ICD-10-CM | POA: Insufficient documentation

## 2013-07-02 DIAGNOSIS — I10 Essential (primary) hypertension: Secondary | ICD-10-CM | POA: Insufficient documentation

## 2013-07-02 DIAGNOSIS — F17200 Nicotine dependence, unspecified, uncomplicated: Secondary | ICD-10-CM | POA: Insufficient documentation

## 2013-07-02 MED ORDER — IBUPROFEN 800 MG PO TABS
800.0000 mg | ORAL_TABLET | Freq: Once | ORAL | Status: AC
  Administered 2013-07-02: 800 mg via ORAL
  Filled 2013-07-02: qty 1

## 2013-07-02 MED ORDER — HYDROCODONE-ACETAMINOPHEN 5-325 MG PO TABS
1.0000 | ORAL_TABLET | Freq: Once | ORAL | Status: AC
  Administered 2013-07-02: 1 via ORAL
  Filled 2013-07-02: qty 1

## 2013-07-02 NOTE — ED Notes (Signed)
Pt placed on LSB and c-collar PTA by [facility]
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
## 2013-07-02 NOTE — ED Notes (Signed)
Pt with HA all over, " feels like my face is numb", pain with palpation to right shoulder, hip and knee
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
## 2013-07-04 IMAGING — CT CT ANGIO CHEST
1 of 6 series · 5 of 36 positions shown · IV contrast (Omnipaque 300)
Comparison: None

CLINICAL DATA: 40-year-old pregnant female with chest pain and
shortness of breath.

CT ANGIOGRAPHY CHEST
TECHNIQUE: Multidetector CT imaging of the chest using the
standard protocol during bolus administration of intravenous
contrast. Multiplanar reconstructed images including MIPs were
obtained and reviewed to evaluate the vascular anatomy.
Contrast: 100mL OMNIPAQUE IOHEXOL 350 MG/ML SOLN

[Series 6: pe 3.0 b40f · axial · 0.59mm/px · z∈[+212,+356]mm · 5 of 72 slices shown]
[im 12/72  lung]
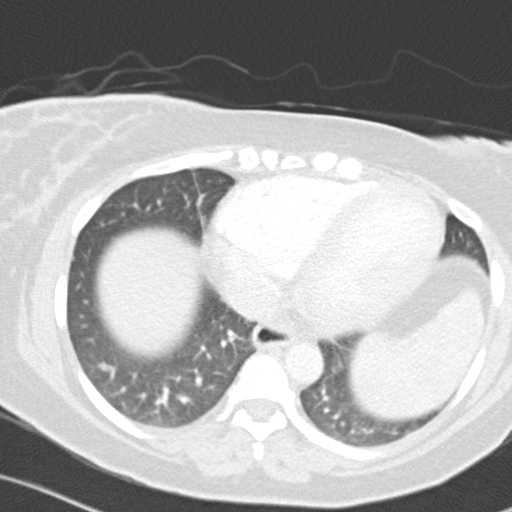
[im 24/72  mediastinal]
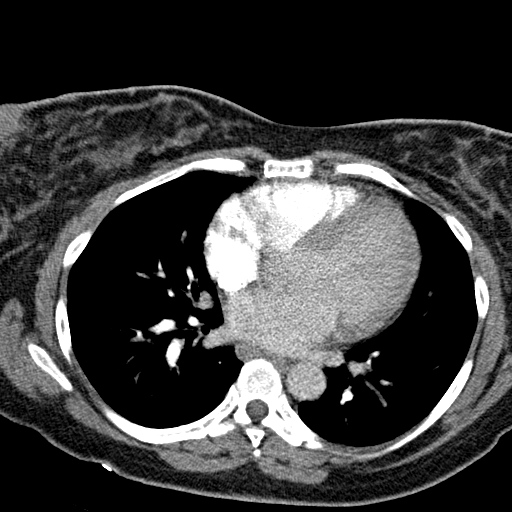
[im 36/72  lung]
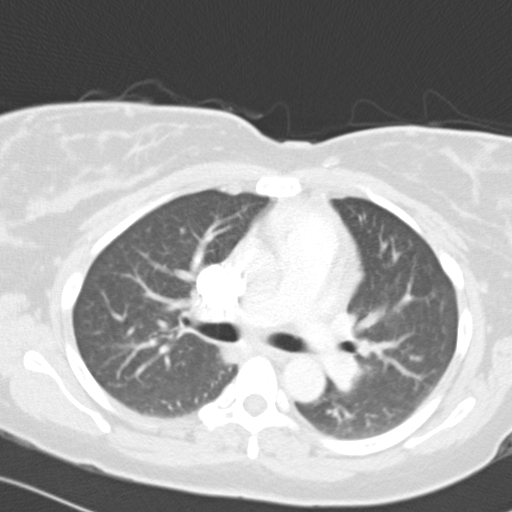
[im 48/72  mediastinal]
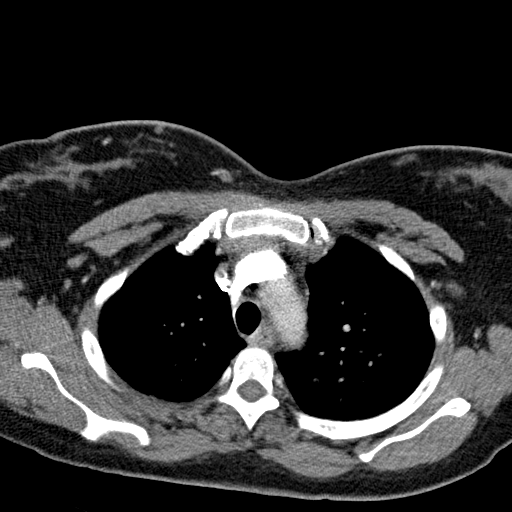
[im 60/72  lung]
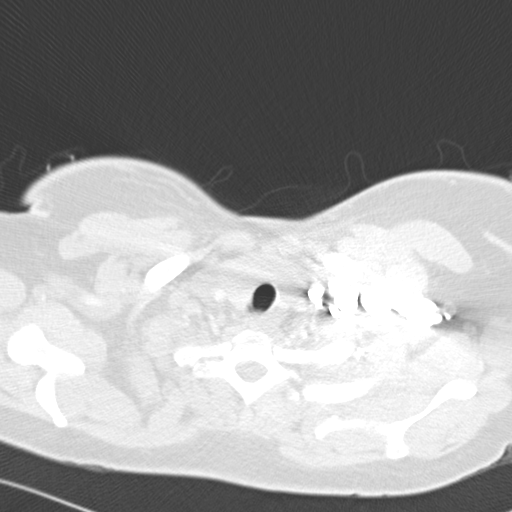

[5 of 36 positions shown; findings below may reference images not displayed]

FINDINGS: This is a technically inadequate study as there is poor
contrast opacification of the main and upper pulmonary arteries.
This was discussed with Dr. Klpigbb, who discussed this with Dr.
Cheyo.  They desire not to repeat the study.

No definite pulmonary emboli are identified within the lower lungs.
The main and upper pulmonary arteries are not well opacified.

Upper limits normal heart size noted.
There is no evidence of thoracic aortic aneurysm.

No pleural or pericardial effusions are identified.
No enlarged lymph nodes are noted.

The lungs are clear.
There is no evidence of airspace disease, consolidation, nodule or
mass.
No endobronchial or endotracheal lesions are present.

No acute or suspicious bony abnormalities are present.
IMPRESSION: Suboptimal evaluation for pulmonary emboli as described above.  No
definite pulmonary emboli identified within the lower lobes.

No significant abnormalities identified.

## 2013-07-30 ENCOUNTER — Other Ambulatory Visit (HOSPITAL_COMMUNITY): Payer: Self-pay | Admitting: Orthopedic Surgery

## 2013-07-30 DIAGNOSIS — M25561 Pain in right knee: Secondary | ICD-10-CM

## 2013-08-04 ENCOUNTER — Ambulatory Visit (HOSPITAL_COMMUNITY): Payer: No Typology Code available for payment source

## 2013-08-05 ENCOUNTER — Ambulatory Visit (HOSPITAL_COMMUNITY)
Admission: RE | Admit: 2013-08-05 | Discharge: 2013-08-05 | Disposition: A | Discharge status: Home / Self Care | Payer: No Typology Code available for payment source | Source: Ambulatory Visit | Attending: Orthopedic Surgery | Admitting: Orthopedic Surgery

## 2013-08-05 ENCOUNTER — Encounter (HOSPITAL_COMMUNITY): Payer: Self-pay

## 2013-08-05 DIAGNOSIS — M25569 Pain in unspecified knee: Secondary | ICD-10-CM | POA: Insufficient documentation

## 2013-08-05 DIAGNOSIS — M25561 Pain in right knee: Secondary | ICD-10-CM

## 2014-10-10 ENCOUNTER — Encounter (HOSPITAL_COMMUNITY): Payer: Self-pay

## 2015-07-10 ENCOUNTER — Encounter (HOSPITAL_COMMUNITY): Payer: Self-pay

## 2015-07-10 ENCOUNTER — Emergency Department (HOSPITAL_COMMUNITY)
Admission: EM | Admit: 2015-07-10 | Discharge: 2015-07-10 | Disposition: A | Discharge status: Home / Self Care | Payer: Self-pay | Attending: Emergency Medicine | Admitting: Emergency Medicine

## 2015-07-10 DIAGNOSIS — R197 Diarrhea, unspecified: Secondary | ICD-10-CM

## 2015-07-10 DIAGNOSIS — R011 Cardiac murmur, unspecified: Secondary | ICD-10-CM | POA: Insufficient documentation

## 2015-07-10 DIAGNOSIS — I10 Essential (primary) hypertension: Secondary | ICD-10-CM

## 2015-07-10 DIAGNOSIS — K0889 Other specified disorders of teeth and supporting structures: Secondary | ICD-10-CM

## 2015-07-10 DIAGNOSIS — Z880 Allergy status to penicillin: Secondary | ICD-10-CM | POA: Insufficient documentation

## 2015-07-10 DIAGNOSIS — Z720 Tobacco use: Secondary | ICD-10-CM | POA: Insufficient documentation

## 2015-07-10 DIAGNOSIS — K088 Other specified disorders of teeth and supporting structures: Secondary | ICD-10-CM | POA: Insufficient documentation

## 2015-07-10 MED ORDER — AMLODIPINE BESY-BENAZEPRIL HCL 5-10 MG PO CAPS: 1.0000 | ORAL_CAPSULE | Freq: Every day | ORAL | Status: AC

## 2015-07-10 MED ORDER — ACETAMINOPHEN 325 MG PO TABS
650.0000 mg | ORAL_TABLET | Freq: Once | ORAL | Status: AC
  Administered 2015-07-10: 650 mg via ORAL
  Filled 2015-07-10: qty 2

## 2015-07-10 MED ORDER — IBUPROFEN 800 MG PO TABS: 800.0000 mg | ORAL_TABLET | Freq: Three times a day (TID) | ORAL | Status: AC

## 2015-07-10 MED ORDER — CLINDAMYCIN HCL 150 MG PO CAPS
300.0000 mg | ORAL_CAPSULE | Freq: Once | ORAL | Status: AC
  Administered 2015-07-10: 300 mg via ORAL
  Filled 2015-07-10: qty 2

## 2015-07-10 MED ORDER — IBUPROFEN 800 MG PO TABS
800.0000 mg | ORAL_TABLET | Freq: Once | ORAL | Status: AC
  Administered 2015-07-10: 800 mg via ORAL
  Filled 2015-07-10: qty 1

## 2015-07-10 MED ORDER — CLINDAMYCIN HCL 150 MG PO CAPS: 150.0000 mg | ORAL_CAPSULE | Freq: Four times a day (QID) | ORAL | Status: AC

## 2015-07-10 NOTE — ED Notes (Signed)
Right lower molar pain x1 week. Diarrhea started yesterday

## 2015-07-10 NOTE — Discharge Instructions (Signed)
Please use your blood pressure medications as directed. Please have your blood pressure rechecked by your doctor or clinic it was 184/101 today. It is important that you see a dentist as soon as possible. Dental Pain A tooth ache may be caused by cavities (tooth decay). Cavities expose the nerve of the tooth to air and hot or cold temperatures. It may come from an infection or abscess (also called a boil or furuncle) around your tooth. It is also often caused by dental caries (tooth decay). This causes the pain you are having. DIAGNOSIS  Your caregiver can diagnose this problem by exam. TREATMENT   If caused by an infection, it may be treated with medications which kill germs (antibiotics) and pain medications as prescribed by your caregiver. Take medications as directed.  Only take over-the-counter or prescription medicines for pain, discomfort, or fever as directed by your caregiver.  Whether the tooth ache today is caused by infection or dental disease, you should see your dentist as soon as possible for further care. SEEK MEDICAL CARE IF: The exam and treatment you received today has been provided on an emergency basis only. This is not a substitute for complete medical or dental care. If your problem worsens or new problems (symptoms) appear, and you are unable to meet with your dentist, call or return to this location. SEEK IMMEDIATE MEDICAL CARE IF:   You have a fever.  You develop redness and swelling of your face, jaw, or neck.  You are unable to open your mouth.  You have severe pain uncontrolled by pain medicine. MAKE SURE YOU:   Understand these instructions.  Will watch your condition.  Will get help right away if you are not doing well or get worse. Document Released: 11/25/2005 Document Revised: 02/17/2012 Document Reviewed: 07/13/2008 [facility] Patient Information 2015 [place], [place]. This information is not intended to replace advice given to you by your health care  provider. Make sure you discuss any questions you have with your health care provider.  Hypertension Hypertension is another name for high blood pressure. High blood pressure forces your heart to work harder to pump blood. A blood pressure reading has two numbers, which includes a higher number over a lower number (example: 110/72). HOME CARE   Have your blood pressure rechecked by your doctor.  Only take medicine as told by your doctor. Follow the directions carefully. The medicine does not work as well if you skip doses. Skipping doses also puts you at risk for problems.  Do not smoke.  Monitor your blood pressure at home as told by your doctor. GET HELP IF:  You think you are having a reaction to the medicine you are taking.  You have repeat headaches or feel dizzy.  You have puffiness (swelling) in your ankles.  You have trouble with your vision. GET HELP RIGHT AWAY IF:   You get a very bad headache and are confused.  You feel weak, numb, or faint.  You get chest or belly (abdominal) pain.  You throw up (vomit).  You cannot breathe very well. MAKE SURE YOU:   Understand these instructions.  Will watch your condition.  Will get help right away if you are not doing well or get worse. Document Released: 05/13/2008 Document Revised: 11/30/2013 Document Reviewed: 09/17/2013 [facility] Patient Information 2015 [place], [place]. This information is not intended to replace advice given to you by your health care provider. Make sure you discuss any questions you have with your health care provider.

## 2015-07-10 NOTE — ED Provider Notes (Signed)
CSN: [id number]     Arrival date & time 07/10/15  1001 History   First MD Initiated Contact with Patient 07/10/15 1010     Chief Complaint  Patient presents with  . Dental Pain  . Diarrhea     (Consider location/radiation/quality/duration/timing/severity/associated sxs/prior Treatment) Patient is a 44 y.o. female presenting with tooth pain and diarrhea. The history is provided by the patient.  Dental Pain Location:  Lower Lower teeth location:  19/LL 1st molar Quality:  Aching and throbbing Severity:  Moderate Onset quality:  Gradual Duration:  1 week Timing:  Intermittent Progression:  Worsening Chronicity:  Chronic Context: dental caries   Relieved by:  Nothing Worsened by:  Cold food/drink Ineffective treatments:  Acetaminophen Associated symptoms: gum swelling and headaches   Associated symptoms comment:  Diarrhea Risk factors: lack of dental care and smoking   Risk factors: no chewing tobacco use   Diarrhea Associated symptoms: headaches     Past Medical History  Diagnosis Date  . Hypertension     chronic   Past Surgical History  Procedure Laterality Date  . Dilitation and curretage    . Tubal ligation     History reviewed. No pertinent family history. History  Substance Use Topics  . Smoking status: Current Every Day Smoker -- 1.00 packs/day  . Smokeless tobacco: Not on file  . Alcohol Use: Yes     Comment: occasionally   OB History    Gravida Para Term Preterm AB TAB SAB Ectopic Multiple Living   5 3 2 1 1  1   3      Review of Systems  HENT: Positive for dental problem.   Gastrointestinal: Positive for diarrhea.  Neurological: Positive for headaches.  All other systems reviewed and are negative.     Allergies  Penicillins  Home Medications   Prior to Admission medications   Medication Sig Start Date End Date Taking? Authorizing Provider  amLODipine-benazepril (LOTREL) 5-10 MG per capsule Take 1 capsule by mouth daily.    Historical  Provider, MD  Chlorphen-Pseudoephed-APAP (SINUS PAIN RELIEF MAX ST PO) Take 1-2 capsules by mouth daily as needed (for relief).    Historical Provider, MD   BP 184/101 mmHg  Pulse 71  Temp(Src) 98.1 F (36.7 C) (Oral)  Resp 16  Ht 5\' 4"  (1.626 m)  Wt 162 lb (73.483 kg)  BMI 27.79 kg/m2  SpO2 100% Physical Exam  Constitutional: She is oriented to person, place, and time. She appears well-developed and well-nourished.  Non-toxic appearance.  HENT:  Head: Normocephalic.  Right Ear: Tympanic membrane and external ear normal.  Left Ear: Tympanic membrane and external ear normal.  Mouth/Throat: Uvula is midline, oropharynx is clear and moist and mucous membranes are normal.  There is a deep cavity of the right first molar of the lower jaw. There is swelling of the gum. The airway is patent. There is no swelling under the tongue.  Eyes: EOM and lids are normal. Pupils are equal, round, and reactive to light.  Neck: Normal range of motion. Neck supple. Carotid bruit is not present.  Cardiovascular: Normal rate, regular rhythm, intact distal pulses and normal pulses.   Murmur heard.  Systolic murmur is present with a grade of 2/6  Pulmonary/Chest: Breath sounds normal. No respiratory distress.  Abdominal: Soft. Bowel sounds are normal. There is no tenderness. There is no guarding.  Musculoskeletal: Normal range of motion.  Lymphadenopathy:       Head (right side): No submandibular adenopathy present.  Head (left side): No submandibular adenopathy present.    She has no cervical adenopathy.  Neurological: She is alert and oriented to person, place, and time. She has normal strength. No cranial nerve deficit or sensory deficit.  Skin: Skin is warm and dry.  Psychiatric: She has a normal mood and affect. Her speech is normal.  Nursing note and vitals reviewed.   ED Course  Procedures (including critical care time) Labs Review Labs Reviewed - No data to display  Imaging Review No  results found.   EKG Interpretation None      MDM  Blood pressure is elevated at 184/101. Patient has a history of hypertension. She acknowledges being noncompliant with her blood pressure medications. The patient has some diarrhea problems on the last few days, but states she has sick contacts in her home.  Patient has a deep cavity of the posterior molar of the right lower jaw. Discussed the cardiac murmur heard today. Doubt that it is related to the dental carry. Advised patient to follow this with her primary physician. The patient will be treated with clindamycin and ibuprofen 800 mg and Tylenol.    Final diagnoses:  None    **I have reviewed nursing notes, vital signs, and all appropriate lab and imaging results for this patient.Ivery Quale, PA-C 07/10/15 1047  Rolland Porter, MD 07/14/15 [phone]

## 2015-07-10 NOTE — ED Notes (Signed)
Patient with no complaints at this time. Respirations even and unlabored. Skin warm/dry. Discharge instructions reviewed with patient at this time. Patient given opportunity to voice concerns/ask questions. Patient discharged at this time and left Emergency Department with steady gait.
